# Patient Record
Sex: Male | Born: 1990 | Race: Black or African American | Hispanic: No | Marital: Single | Smoking: Never smoker
Health system: Southern US, Community
[De-identification: ages and names within clinical notes are randomized; demographics above are authoritative.]

---
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
## 2014-01-19 ENCOUNTER — Ambulatory Visit: Payer: Self-pay

## 2014-09-08 ENCOUNTER — Emergency Department (HOSPITAL_COMMUNITY)
Admission: EM | Admit: 2014-09-08 | Discharge: 2014-09-08 | Disposition: A | Discharge status: Home / Self Care | Payer: No Typology Code available for payment source | Attending: Emergency Medicine | Admitting: Emergency Medicine

## 2014-09-08 ENCOUNTER — Encounter (HOSPITAL_COMMUNITY): Payer: Self-pay | Admitting: Emergency Medicine

## 2014-09-08 ENCOUNTER — Emergency Department (HOSPITAL_COMMUNITY): Payer: Self-pay

## 2014-09-08 ENCOUNTER — Emergency Department (HOSPITAL_COMMUNITY): Payer: No Typology Code available for payment source

## 2014-09-08 DIAGNOSIS — Y9389 Activity, other specified: Secondary | ICD-10-CM | POA: Insufficient documentation

## 2014-09-08 DIAGNOSIS — Y92410 Unspecified street and highway as the place of occurrence of the external cause: Secondary | ICD-10-CM | POA: Insufficient documentation

## 2014-09-08 DIAGNOSIS — M545 Low back pain, unspecified: Secondary | ICD-10-CM

## 2014-09-08 DIAGNOSIS — IMO0002 Reserved for concepts with insufficient information to code with codable children: Secondary | ICD-10-CM | POA: Insufficient documentation

## 2014-09-08 DIAGNOSIS — M546 Pain in thoracic spine: Secondary | ICD-10-CM

## 2014-09-08 DIAGNOSIS — S298XXA Other specified injuries of thorax, initial encounter: Secondary | ICD-10-CM | POA: Insufficient documentation
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
## 2014-09-08 NOTE — ED Notes (Signed)
Patient transported to X-ray 

## 2014-09-08 NOTE — Progress Notes (Signed)
[facility] Community Liaison Stacy, ° °Provided pt with a list of primary care resources to help patient establish a pcp.  °
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
## 2014-09-08 NOTE — ED Notes (Signed)
MD at bedside. 
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
## 2016-12-07 ENCOUNTER — Encounter (HOSPITAL_COMMUNITY): Payer: Self-pay | Admitting: Emergency Medicine

## 2016-12-07 ENCOUNTER — Emergency Department (HOSPITAL_COMMUNITY)
Admission: EM | Admit: 2016-12-07 | Discharge: 2016-12-07 | Disposition: A | Discharge status: Home / Self Care | Payer: Medicaid - Out of State | Attending: Emergency Medicine | Admitting: Emergency Medicine

## 2016-12-07 DIAGNOSIS — K0889 Other specified disorders of teeth and supporting structures: Secondary | ICD-10-CM | POA: Insufficient documentation

## 2016-12-07 MED ORDER — PENICILLIN V POTASSIUM 500 MG PO TABS: 500.0000 mg | ORAL_TABLET | Freq: Three times a day (TID) | ORAL | 0 refills | Status: DC

## 2016-12-07 MED ORDER — IBUPROFEN 800 MG PO TABS: 800.0000 mg | ORAL_TABLET | Freq: Three times a day (TID) | ORAL | 0 refills | Status: AC

## 2016-12-07 NOTE — ED Provider Notes (Signed)
[facility] Provider Note      History   Chief Complaint Chief Complaint  Patient presents with  . Dental Pain    The history is provided by the patient and medical records. No language interpreter was used.    HPI Comments:  Joshua Cervantes is a 25 y.o. male who presents to the Emergency Department complaining of Dental pain. Patient state he cracked one of his tooth several months ago but it never cause any significant discomfort. However for the past 2-3 days he has noticed increasing sharp throbbing pain to the affected tooth. Pain is moderate in severity, he tries using Tylenol and warm compress with some improvement. Increasing pain with chewing. Denies pain with temperature change. Denies having fever, ear pain, trouble swallowing, neck pain. He does not have a dentist.   History reviewed. No pertinent past medical history.  There are no active problems to display for this patient.   History reviewed. No pertinent surgical history.     Home Medications    Prior to Admission medications   Medication Sig Start Date End Date Taking? Authorizing Provider  acetaminophen (TYLENOL) 325 MG tablet Take 1,300 mg by mouth every 6 (six) hours as needed (pain).    Historical Provider, MD    Family History History reviewed. No pertinent family history.  Social History Social History  Substance Use Topics  . Smoking status: Never Smoker  . Smokeless tobacco: Not on file  . Alcohol use No     Allergies   Patient has no known allergies.   Review of Systems Review of Systems  HENT: Positive for dental problem.      Physical Exam Updated Vital Signs BP 120/78 (BP Location: Right Arm)   Pulse 66   Temp 98.7 F (37.1 C) (Oral)   Resp 18   SpO2 100%   Physical Exam  Constitutional: He appears well-developed and well-nourished. No distress.  HENT:  Head: Normocephalic and atraumatic.  Mouth/Throat: Uvula is midline and oropharynx is clear and moist.  Mucous membranes are not dry. No uvula swelling. No oropharyngeal exudate, posterior oropharyngeal edema, posterior oropharyngeal erythema or tonsillar abscesses.  Mouth: Tooth #29, is partially avulsed, with dental decay, tender to palpation. Overgrowth of gum to the surrounding molars, likely chronic. No obvious abscess appreciated. No trismus.  Neck: Normal range of motion. Neck supple.  Cardiovascular: Normal rate.   Pulmonary/Chest: Effort normal and breath sounds normal. No stridor.  Lymphadenopathy:    He has no cervical adenopathy.  Neurological: He is alert.  Skin: He is not diaphoretic.  Nursing note and vitals reviewed.    ED Treatments / Results  DIAGNOSTIC STUDIES: Oxygen Saturation is 100% on RA, normal by my interpretation.   COORDINATION OF CARE: 1:10 PM- Pt verbalizes understanding and agrees to plan.  Medications - No data to display  Labs (all labs ordered are listed, but only abnormal results are displayed) Labs Reviewed - No data to display  EKG  EKG Interpretation None       Radiology No results found.  Procedures Procedures (including critical care time)  Medications Ordered in ED Medications - No data to display   Initial Impression / Assessment and Plan / ED Course  I have reviewed the triage vital signs and the nursing notes.  Pertinent labs & imaging results that were available during my care of the patient were reviewed by me and considered in my medical decision making (see chart for details).  Clinical Course  BP 120/78 (BP Location: Right Arm)   Pulse 66   Temp 98.7 F (37.1 C) (Oral)   Resp 18   SpO2 100%      Final Clinical Impressions(s) / ED Diagnoses   Final diagnoses:  Pain, dental    New Prescriptions New Prescriptions   IBUPROFEN (ADVIL,MOTRIN) 800 MG TABLET    Take 1 tablet (800 mg total) by mouth 3 (three) times daily.   PENICILLIN V POTASSIUM (VEETID) 500 MG TABLET    Take 1 tablet (500 mg total) by  mouth 3 (three) times daily.   2:53 PM Dental pain likely from a dental [place]. No deep infection noted. Will treat with antibiotic, NSAIDs medication, and dental referral.   Fayrene HelperBowie Lavaeh Bau, PA-C 12/07/16 1453    Shaune Pollackameron Isaacs, MD 12/08/16 [phone]

## 2016-12-07 NOTE — ED Notes (Signed)
Patient d/c'd self care.  F/U and medications reviewed.  Patient verbalized understanding. 

## 2016-12-07 NOTE — ED Triage Notes (Signed)
Pt reports he cracked his tooth a few months ago. Now having increased pain.

## 2019-12-28 ENCOUNTER — Ambulatory Visit (HOSPITAL_COMMUNITY)
Admission: EM | Admit: 2019-12-28 | Discharge: 2019-12-28 | Disposition: A | Discharge status: Home / Self Care | Payer: Medicaid - Out of State

## 2019-12-28 ENCOUNTER — Encounter (HOSPITAL_COMMUNITY): Payer: Self-pay

## 2019-12-28 ENCOUNTER — Other Ambulatory Visit: Payer: Self-pay

## 2019-12-28 DIAGNOSIS — H6121 Impacted cerumen, right ear: Secondary | ICD-10-CM

## 2019-12-28 NOTE — ED Triage Notes (Signed)
Pt presents with right ear pain and fullness for past few days.

## 2019-12-28 NOTE — Discharge Instructions (Signed)
We cleaned the ear wax out of your ear.  There is no signs of infection You can do Debrox eardrops over-the-counter Do not use Q-tips

## 2019-12-29 NOTE — ED Provider Notes (Signed)
[facility]    CSN: [id number] Arrival date & time: 12/28/19  6606      History   Chief Complaint Chief Complaint  Patient presents with  . Otalgia    Right    HPI Joshua Cervantes is a 28 y.o. male.   Patient is a 28 year old male who presents today with right ear fullness and mild discomfort over the past few days.  Symptoms have been constant.  He has had trouble hearing at the ear.  Denies any foreign bodies.  Does use use Q-tips.  No nasal congestion, rhinorrhea, fever, cough, congestion.  ROS per HPI      History reviewed. No pertinent past medical history.  There are no problems to display for this patient.   History reviewed. No pertinent surgical history.     Home Medications    Prior to Admission medications   Medication Sig Start Date End Date Taking? Authorizing Provider  acetaminophen (TYLENOL) 325 MG tablet Take 1,300 mg by mouth every 6 (six) hours as needed (pain).    [provider]  ibuprofen (ADVIL,MOTRIN) 800 MG tablet Take 1 tablet (800 mg total) by mouth 3 (three) times daily. 12/07/16   Fayrene Helper, PA-C    Family History Family History  Family history unknown: Yes    Social History Social History   Tobacco Use  . Smoking status: Never Smoker  Substance Use Topics  . Alcohol use: No  . Drug use: No     Allergies   Patient has no known allergies.   Review of Systems Review of Systems   Physical Exam Triage Vital Signs ED Triage Vitals  Enc Vitals Group     BP 12/28/19 0947 124/68     Pulse Rate 12/28/19 0947 62     Resp 12/28/19 0947 18     Temp 12/28/19 0947 98 F (36.7 C)     Temp Source 12/28/19 0947 Oral     SpO2 12/28/19 0947 100 %     Weight --      Height --      Head Circumference --      Peak Flow --      Pain Score 12/28/19 0945 9     Pain Loc --      Pain Edu? --      Excl. in GC? --    No data found.  Updated Vital Signs BP 124/68 (BP Location: Right Arm)   Pulse 62    Temp 98 F (36.7 C) (Oral)   Resp 18   SpO2 100%   Visual Acuity Right Eye Distance:   Left Eye Distance:   Bilateral Distance:    Right Eye Near:   Left Eye Near:    Bilateral Near:     Physical Exam Vitals and nursing note reviewed.  Constitutional:      Appearance: Normal appearance.  HENT:     Head: Normocephalic and atraumatic.     Right Ear: There is impacted cerumen.     Left Ear: There is impacted cerumen.     Nose: Nose normal.  Eyes:     Conjunctiva/sclera: Conjunctivae normal.  Pulmonary:     Effort: Pulmonary effort is normal.  Abdominal:     Palpations: Abdomen is soft.     Tenderness: There is no abdominal tenderness.  Musculoskeletal:        General: Normal range of motion.     Cervical back: Normal range of motion.  Skin:  General: Skin is warm and dry.  Neurological:     Mental Status: He is alert.  Psychiatric:        Mood and Affect: Mood normal.      UC Treatments / Results  Labs (all labs ordered are listed, but only abnormal results are displayed) Labs Reviewed - No data to display  EKG   Radiology No results found.  Procedures Procedures (including critical care time)  Medications Ordered in UC Medications - No data to display  Initial Impression / Assessment and Plan / UC Course  I have reviewed the triage vital signs and the nursing notes.  Pertinent labs & imaging results that were available during my care of the patient were reviewed by me and considered in my medical decision making (see chart for details).     Bilateral cerumen impaction more on the right. Ears cleaned out with irrigation here in clinic.  Patient feeling much better. No signs of infection post treatment. Recommended Debrox and avoiding Q-tips Final Clinical Impressions(s) / UC Diagnoses   Final diagnoses:  Impacted cerumen of right ear     Discharge Instructions     We cleaned the ear wax out of your ear.  There is no signs of  infection You can do Debrox eardrops over-the-counter Do not use Q-tips    ED Prescriptions    None     PDMP not reviewed this encounter.   Orvan July, NP 12/29/19 [phone]

## 2023-11-12 ENCOUNTER — Other Ambulatory Visit: Payer: Self-pay

## 2023-11-12 DIAGNOSIS — Z3141 Encounter for fertility testing: Secondary | ICD-10-CM
